# Patient Record
Sex: Male | Born: 2005 | Race: White | Hispanic: No | Marital: Single | Smoking: Never smoker
Health system: Southern US, Community
[De-identification: ages and names within clinical notes are randomized; demographics above are authoritative.]

---
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
## 2023-10-04 IMAGING — DX DG TIBIA/FIBULA 2V*R*
4 series · 4 of 4 positions shown · non-contrast
Comparison: None.

CLINICAL DATA: Laceration to the proximal right lower leg
anterolaterally. Question foreign body.

EXAM:
RIGHT TIBIA AND FIBULA - 2 VIEW

[tibia ap (1 of 2)]
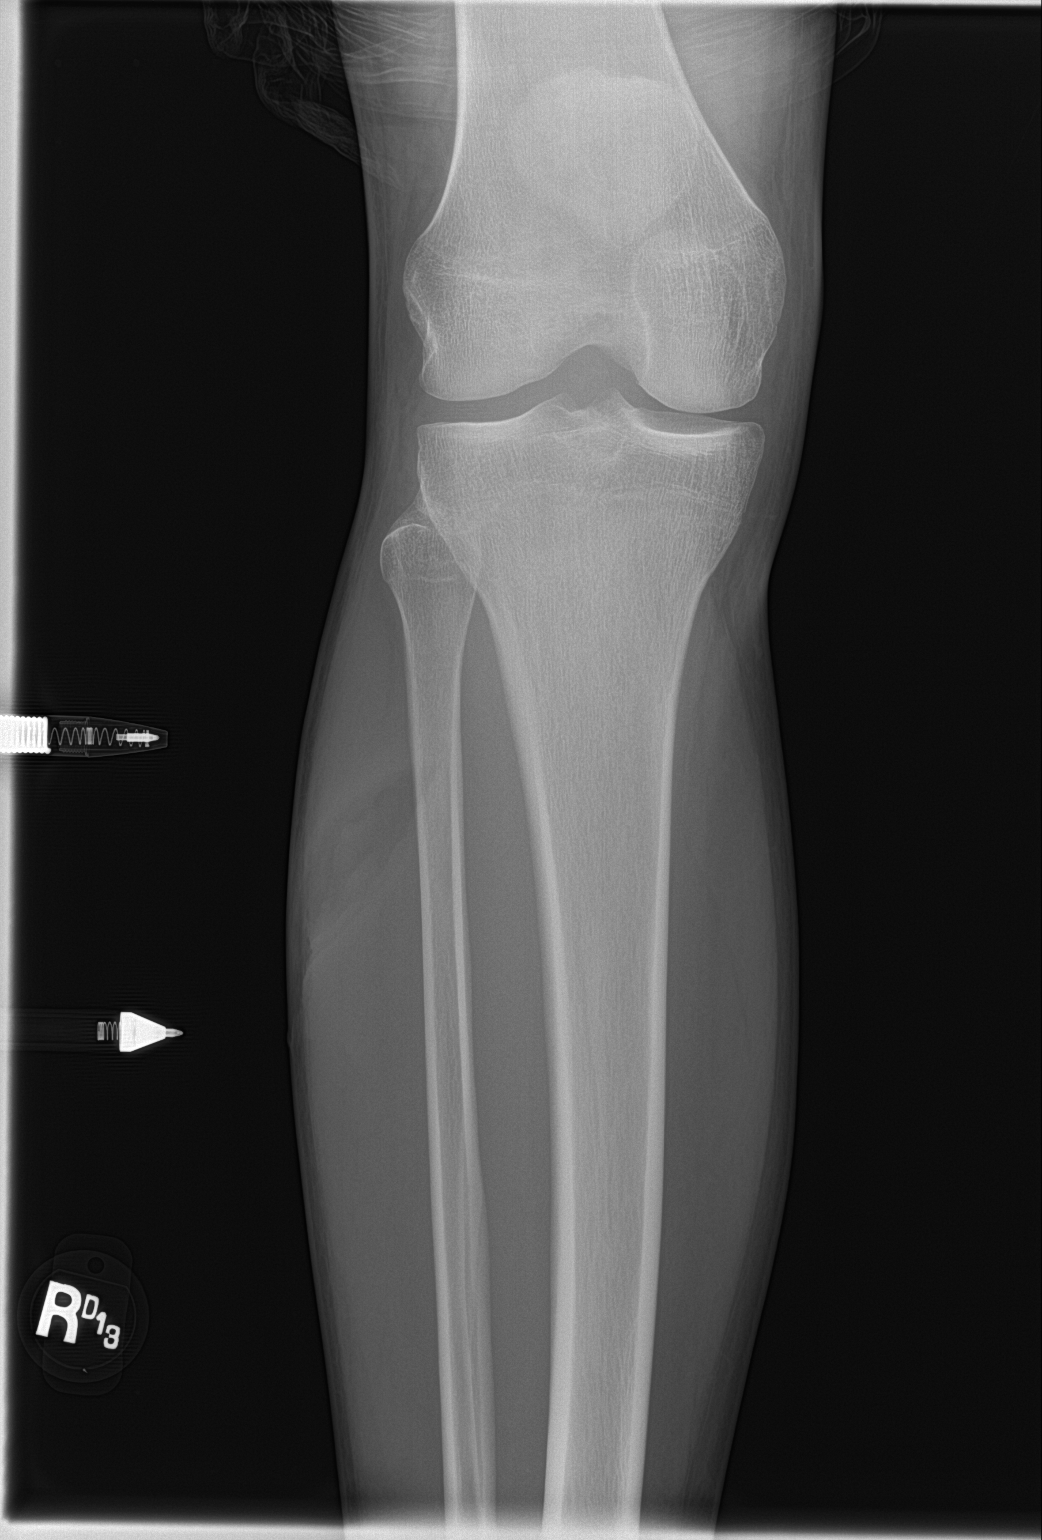

[tibia ap (2 of 2)]
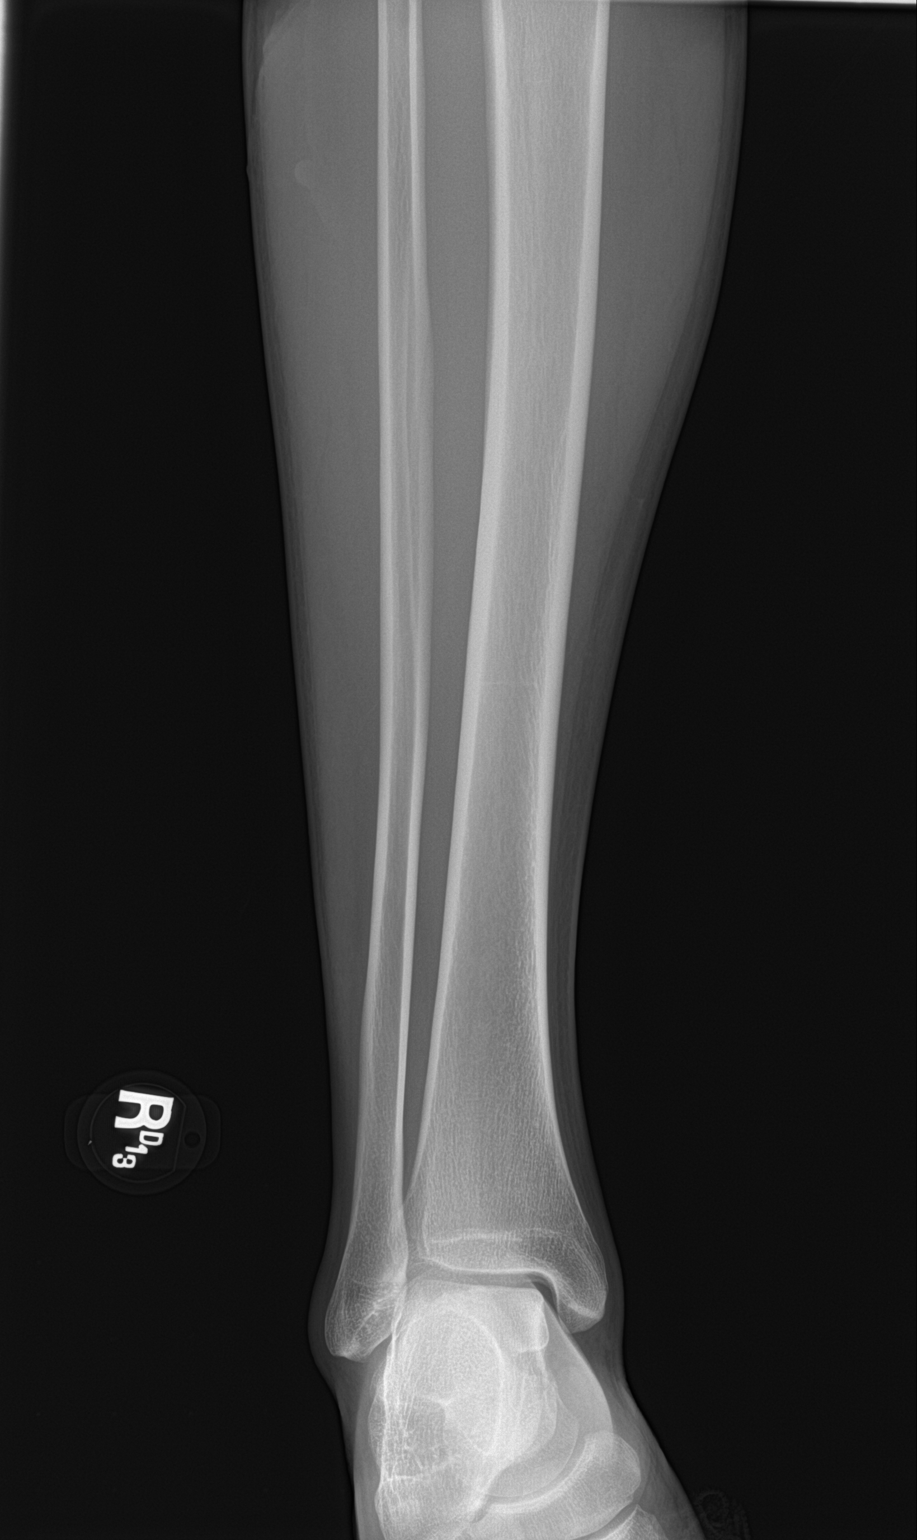

[tibia lat (1 of 2)]
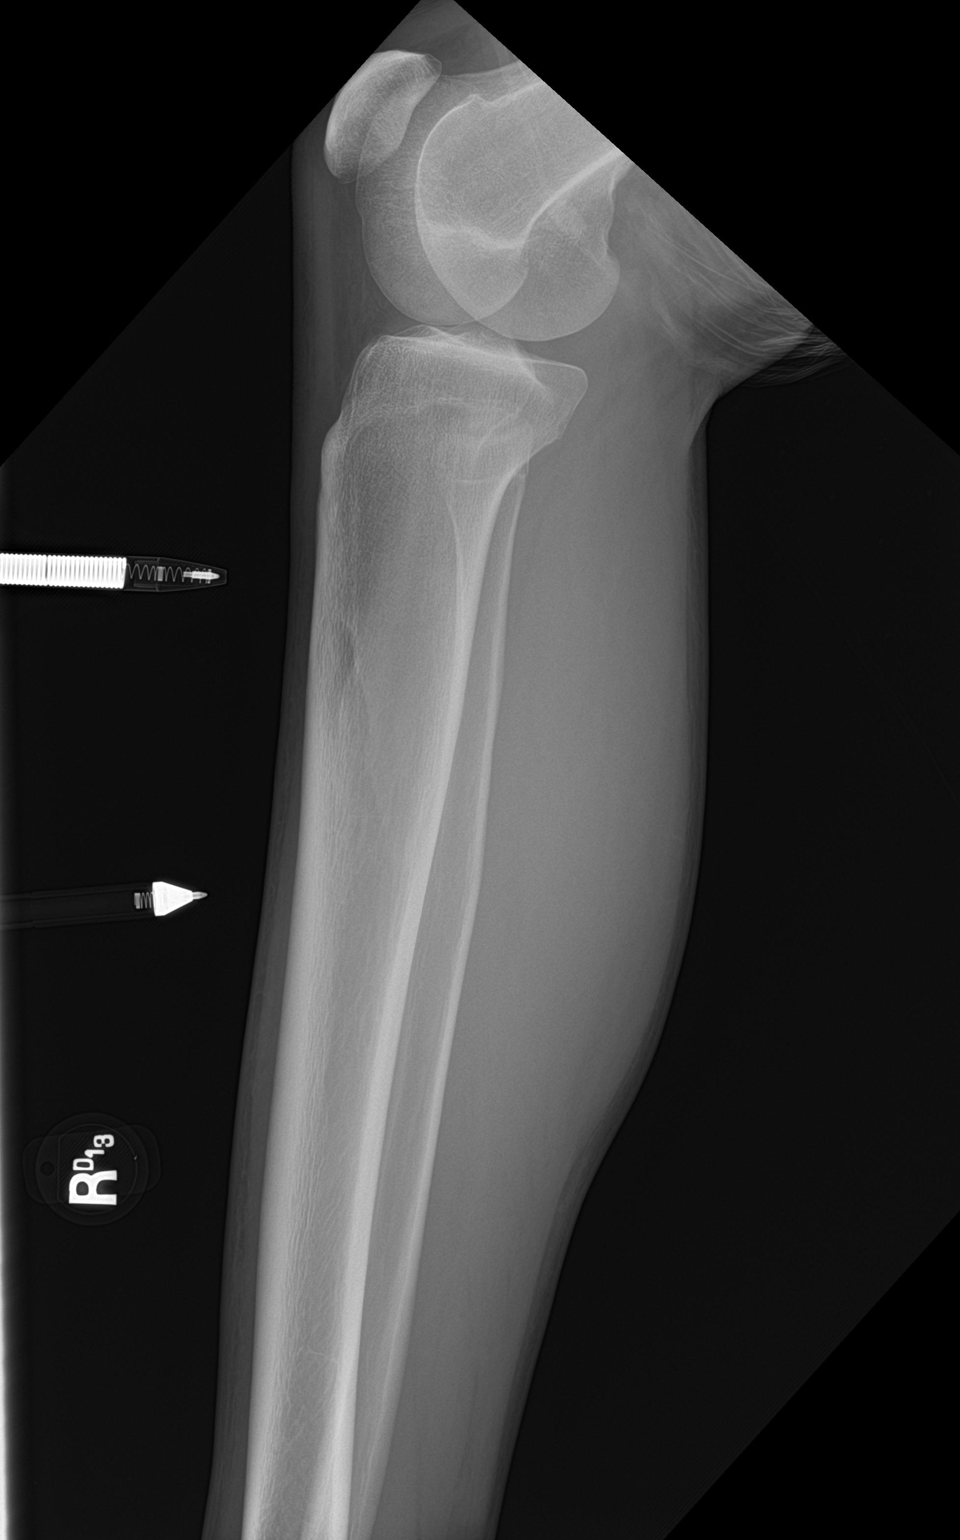

[tibia lat (2 of 2)]
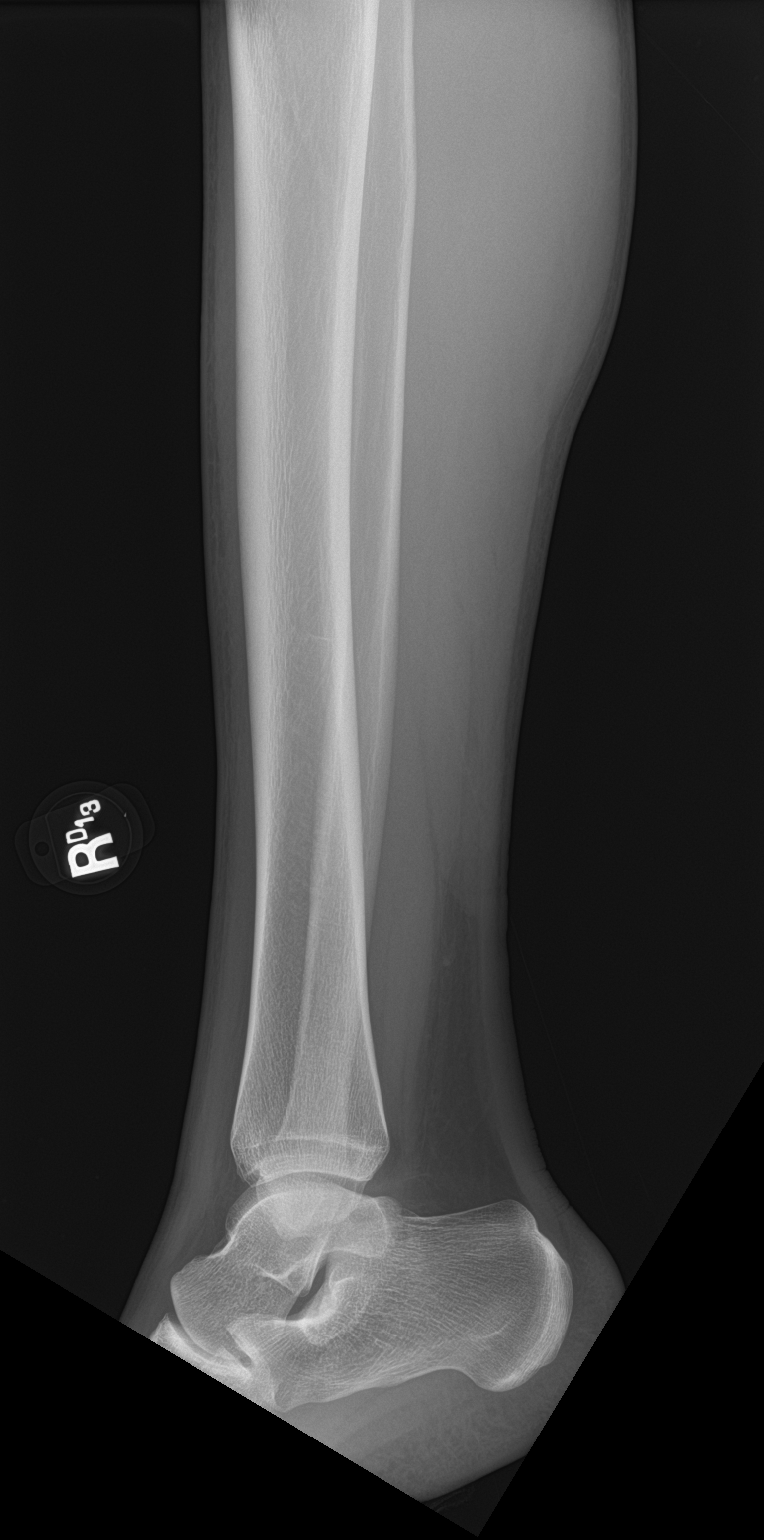

[4 of 4 positions shown; findings below may reference images not displayed]

FINDINGS: Lateral proximal soft tissue defect consistent with an open wound as
indicated by overlying pens. No unexpected foreign bodies are
identified. There is no evidence of acute fracture, dislocation or
bone destruction.
IMPRESSION: Soft tissue defect in the proximal lower leg consistent with a known
laceration. No evidence of foreign body or osseous abnormality.
